# Patient Record
Sex: Male | Born: 1950 | Race: White | Marital: Married | Smoking: Former smoker
Health system: Southern US, Community
[De-identification: ages and names within clinical notes are randomized; demographics above are authoritative.]

## PROBLEM LIST (undated history)

## (undated) DIAGNOSIS — Z87442 Personal history of urinary calculi: Secondary | ICD-10-CM

## (undated) DIAGNOSIS — M1990 Unspecified osteoarthritis, unspecified site: Secondary | ICD-10-CM

## (undated) DIAGNOSIS — I10 Essential (primary) hypertension: Secondary | ICD-10-CM

## (undated) HISTORY — PX: CYSTO: SHX6284

## (undated) HISTORY — PX: BACK SURGERY: SHX140

---

## 2013-07-17 ENCOUNTER — Other Ambulatory Visit: Payer: Self-pay | Admitting: Neurosurgery

## 2013-07-17 DIAGNOSIS — M5126 Other intervertebral disc displacement, lumbar region: Secondary | ICD-10-CM

## 2013-07-18 ENCOUNTER — Ambulatory Visit
Admission: RE | Admit: 2013-07-18 | Discharge: 2013-07-18 | Disposition: A | Discharge status: Home / Self Care | Payer: BC Managed Care – PPO | Source: Ambulatory Visit | Attending: Neurosurgery | Admitting: Neurosurgery

## 2013-07-18 ENCOUNTER — Other Ambulatory Visit: Payer: Self-pay | Admitting: Neurosurgery

## 2013-07-18 VITALS — BP 145/92 | HR 88

## 2013-07-18 DIAGNOSIS — M5126 Other intervertebral disc displacement, lumbar region: Secondary | ICD-10-CM

## 2013-07-18 MED ORDER — METHYLPREDNISOLONE ACETATE 40 MG/ML INJ SUSP (RADIOLOG
120.0000 mg | Freq: Once | INTRAMUSCULAR | Status: AC
  Administered 2013-07-18: 120 mg via EPIDURAL

## 2013-07-18 MED ORDER — IOHEXOL 180 MG/ML  SOLN
1.0000 mL | Freq: Once | INTRAMUSCULAR | Status: AC | PRN
Start: 2013-07-18 — End: 2013-07-18
  Administered 2013-07-18: 1 mL via EPIDURAL

## 2013-07-18 NOTE — Discharge Instructions (Signed)

## 2013-07-31 ENCOUNTER — Other Ambulatory Visit: Payer: Self-pay | Admitting: Neurosurgery

## 2013-07-31 DIAGNOSIS — M5126 Other intervertebral disc displacement, lumbar region: Secondary | ICD-10-CM

## 2013-08-01 ENCOUNTER — Ambulatory Visit
Admission: RE | Admit: 2013-08-01 | Discharge: 2013-08-01 | Disposition: A | Discharge status: Home / Self Care | Payer: BC Managed Care – PPO | Source: Ambulatory Visit | Attending: Neurosurgery | Admitting: Neurosurgery

## 2013-08-01 DIAGNOSIS — M5126 Other intervertebral disc displacement, lumbar region: Secondary | ICD-10-CM

## 2013-08-01 MED ORDER — IOHEXOL 180 MG/ML  SOLN
1.0000 mL | Freq: Once | INTRAMUSCULAR | Status: AC | PRN
  Administered 2013-08-01: 1 mL via EPIDURAL

## 2013-08-01 MED ORDER — METHYLPREDNISOLONE ACETATE 40 MG/ML INJ SUSP (RADIOLOG
120.0000 mg | Freq: Once | INTRAMUSCULAR | Status: AC
  Administered 2013-08-01: 120 mg via EPIDURAL

## 2013-08-16 ENCOUNTER — Other Ambulatory Visit: Payer: Self-pay | Admitting: Neurosurgery

## 2013-08-17 ENCOUNTER — Encounter (HOSPITAL_COMMUNITY): Payer: Self-pay

## 2013-08-17 ENCOUNTER — Encounter (HOSPITAL_COMMUNITY)
Admission: RE | Admit: 2013-08-17 | Discharge: 2013-08-17 | Disposition: A | Discharge status: Home / Self Care | Payer: BC Managed Care – PPO | Source: Ambulatory Visit | Attending: Anesthesiology | Admitting: Anesthesiology

## 2013-08-17 ENCOUNTER — Encounter (HOSPITAL_COMMUNITY)
Admission: RE | Admit: 2013-08-17 | Discharge: 2013-08-17 | Disposition: A | Discharge status: Home / Self Care | Payer: BC Managed Care – PPO | Source: Ambulatory Visit | Attending: Neurosurgery | Admitting: Neurosurgery

## 2013-08-17 DIAGNOSIS — Z01818 Encounter for other preprocedural examination: Secondary | ICD-10-CM | POA: Insufficient documentation

## 2013-08-17 DIAGNOSIS — Z01810 Encounter for preprocedural cardiovascular examination: Secondary | ICD-10-CM | POA: Insufficient documentation

## 2013-08-17 DIAGNOSIS — Z01812 Encounter for preprocedural laboratory examination: Secondary | ICD-10-CM | POA: Insufficient documentation

## 2013-08-17 HISTORY — DX: Unspecified osteoarthritis, unspecified site: M19.90

## 2013-08-17 HISTORY — DX: Personal history of urinary calculi: Z87.442

## 2013-08-17 HISTORY — DX: Essential (primary) hypertension: I10

## 2013-08-17 LAB — CBC
HCT: 49.1 % (ref 39.0–52.0)
Hemoglobin: 17.7 g/dL — ABNORMAL HIGH (ref 13.0–17.0)
MCH: 33.5 pg (ref 26.0–34.0)
MCHC: 36 g/dL (ref 30.0–36.0)
MCV: 93 fL (ref 78.0–100.0)
PLATELETS: 222 10*3/uL (ref 150–400)
RBC: 5.28 MIL/uL (ref 4.22–5.81)
RDW: 13.5 % (ref 11.5–15.5)
WBC: 9.4 10*3/uL (ref 4.0–10.5)

## 2013-08-17 LAB — BASIC METABOLIC PANEL
BUN: 16 mg/dL (ref 6–23)
CALCIUM: 9.6 mg/dL (ref 8.4–10.5)
CO2: 25 meq/L (ref 19–32)
Chloride: 96 mEq/L (ref 96–112)
Creatinine, Ser: 0.81 mg/dL (ref 0.50–1.35)
GFR calc non Af Amer: >90 mL/min (ref >90)
Glucose, Bld: 106 mg/dL — ABNORMAL HIGH (ref 70–99)
Potassium: 3.7 mEq/L (ref 3.7–5.3)
Sodium: 137 mEq/L (ref 137–147)

## 2013-08-17 LAB — SURGICAL PCR SCREEN
MRSA, PCR: NEGATIVE
STAPHYLOCOCCUS AUREUS: NEGATIVE

## 2013-08-17 NOTE — Progress Notes (Signed)
08/17/13 1506  OBSTRUCTIVE SLEEP APNEA  Have you ever been diagnosed with sleep apnea through a sleep study? No  Do you snore loudly (loud enough to be heard through closed doors)?  0  Do you often feel tired, fatigued, or sleepy during the daytime? 0  Has anyone observed you stop breathing during your sleep? 1  Do you have, or are you being treated for high blood pressure? 1  BMI more than 35 kg/m2? 1  Age over 34102 years old? 1  Neck circumference greater than 40 cm/18 inches? 1  Gender: 1  Obstructive Sleep Apnea Score 6  Score 4 or greater  Results sent to PCP

## 2013-08-17 NOTE — Pre-Procedure Instructions (Addendum)
Jacob Sexton  08/17/2013   Your procedure is scheduled on:  08/21/13  Report to [facility] short stay admitting at 1000 AM.  Call this number if you have problems the morning of surgery: [phone]   Remember:   Do not eat food or drink liquids after midnight.   Take these medicines the morning of surgery with A SIP OF WATER: none             STOP all herbel meds, nsaids (aleve,naproxen,advil,ibuprofen) including vitamins, aspirin now   Do not wear jewelry, make-up or nail polish.  Do not wear lotions, powders, or perfumes. You may wear deodorant.  Do not shave 48 hours prior to surgery. Men may shave face and neck.  Do not bring valuables to the hospital.  [facility] is not responsible                  for any belongings or valuables.               Contacts, dentures or bridgework may not be worn into surgery.  Leave suitcase in the car. After surgery it may be brought to your room.  For patients admitted to the hospital, discharge time is determined by your                treatment team.               Patients discharged the day of surgery will not be allowed to drive  home.  Name and phone number of your driver:   Special Instructions:  Special Instructions: [place] - Preparing for Surgery  Before surgery, you can play an important role.  Because skin is not sterile, your skin needs to be as free of germs as possible.  You can reduce the number of germs on you skin by washing with CHG (chlorahexidine gluconate) soap before surgery.  CHG is an antiseptic cleaner which kills germs and bonds with the skin to continue killing germs even after washing.  Please DO NOT use if you have an allergy to CHG or antibacterial soaps.  If your skin becomes reddened/irritated stop using the CHG and inform your nurse when you arrive at Short Stay.  Do not shave (including legs and underarms) for at least 48 hours prior to the first CHG shower.  You may shave your face.  Please follow these  instructions carefully:   1.  Shower with CHG Soap the night before surgery and the morning of Surgery.  2.  If you choose to wash your hair, wash your hair first as usual with your normal shampoo.  3.  After you shampoo, rinse your hair and body thoroughly to remove the Shampoo.  4.  Use CHG as you would any other liquid soap.  You can apply chg directly  to the skin and wash gently with scrungie or a clean washcloth.  5.  Apply the CHG Soap to your body ONLY FROM THE NECK DOWN.  Do not use on open wounds or open sores.  Avoid contact with your eyes ears, mouth and genitals (private parts).  Wash genitals (private parts)       with your normal soap.  6.  Wash thoroughly, paying special attention to the area where your surgery will be performed.  7.  Thoroughly rinse your body with warm water from the neck down.  8.  DO NOT shower/wash with your normal soap after using and rinsing off the CHG Soap.  9.  Pat yourself dry with a clean towel.            10.  Wear clean pajamas.            11.  Place clean sheets on your bed the night of your first shower and do not sleep with pets.  Day of Surgery  Do not apply any lotions/deodorants the morning of surgery.  Please wear clean clothes to the hospital/surgery center.   Please read over the following fact sheets that you were given: Pain Booklet, Coughing and Deep Breathing and Surgical Site Infection Prevention mrsa screen

## 2013-08-17 NOTE — Progress Notes (Addendum)
req'd notes ,ekg, tests from dr Molinda Bailiffernest smith [facility]  [phone]  req'd op note, discharge note, anesthesia record, e[facility] [place] Chart left for allison zelenak pa to review

## 2013-08-17 NOTE — Progress Notes (Signed)
08/17/13 1445  OBSTRUCTIVE SLEEP APNEA  Have you ever been diagnosed with sleep apnea through a sleep study? No  Do you snore loudly (loud enough to be heard through closed doors)?  0  Do you often feel tired, fatigued, or sleepy during the daytime? 0  Has anyone observed you stop breathing during your sleep? 1  Do you have, or are you being treated for high blood pressure? 1  BMI more than 35 kg/m2? 1  Age over 61 years old? 1  Neck circumference greater than 40 cm/18 inches? 1 (19)  Gender: 1  Obstructive Sleep Apnea Score 6  Score 4 or greater  Results sent to PCP

## 2013-08-20 MED ORDER — CEFAZOLIN SODIUM-DEXTROSE 2-3 GM-% IV SOLR
2.0000 g | INTRAVENOUS | Status: AC
  Administered 2013-08-21: 2 g via INTRAVENOUS
  Filled 2013-08-20: qty 50

## 2013-08-20 MED ORDER — DEXAMETHASONE SODIUM PHOSPHATE 10 MG/ML IJ SOLN
10.0000 mg | INTRAMUSCULAR | Status: AC
  Administered 2013-08-21: 10 mg via INTRAVENOUS
  Filled 2013-08-20: qty 1

## 2013-08-20 NOTE — Progress Notes (Signed)
Anesthesia Chart Review:  Patient is a 63 year old male scheduled for right L5-S1 redo microdiskectomy on 08/21/13 by Dr. Gerlene FeeKritzer.  History includes former smoker, HTN, nephrolithiasis, arthritis, right L5-S1 laminotomy for excision of herniated disk on 05/25/13. BMi 38.7.  PCP is Molinda BailiffErnest Smith, PA-C with [facility] at [place].  EKG on 08/17/13 showed SR with first degree AVB, LAD, inferior infarct (age undetermined), anterior infarct (age undetermined).  Poor r wave progression in precordial leads in new since 01/29/13.   CXR on 08/17/13 showed minimal left basilar atelectasis noted.  Preoperative labs noted.  He tolerated surgery in 05/2013. No CV symptoms documented at his PAT visit on Friday.  If no acute changes then I anticipate that he can proceed as planned.  Anesthesiologist Dr. Michelle Piperssey agrees with this plan.  Velna Ochsllison Keylie Beavers, PA-C [facility]/Anesthesiology Phone [phone] 08/20/2013 11:22 AM

## 2013-08-21 ENCOUNTER — Encounter (HOSPITAL_COMMUNITY)
Admission: RE | Disposition: A | Discharge status: Home / Self Care | Payer: Self-pay | Source: Ambulatory Visit | Attending: Neurosurgery

## 2013-08-21 ENCOUNTER — Ambulatory Visit (HOSPITAL_COMMUNITY): Payer: BC Managed Care – PPO | Admitting: Anesthesiology

## 2013-08-21 ENCOUNTER — Encounter (HOSPITAL_COMMUNITY): Payer: BC Managed Care – PPO | Admitting: Vascular Surgery

## 2013-08-21 ENCOUNTER — Encounter (HOSPITAL_COMMUNITY): Payer: Self-pay | Admitting: Anesthesiology

## 2013-08-21 ENCOUNTER — Ambulatory Visit (HOSPITAL_COMMUNITY)
Admission: RE | Admit: 2013-08-21 | Discharge: 2013-08-22 | Disposition: A | Discharge status: Home / Self Care | Payer: BC Managed Care – PPO | Source: Ambulatory Visit | Attending: Neurosurgery | Admitting: Neurosurgery

## 2013-08-21 DIAGNOSIS — Z87891 Personal history of nicotine dependence: Secondary | ICD-10-CM | POA: Insufficient documentation

## 2013-08-21 DIAGNOSIS — M5126 Other intervertebral disc displacement, lumbar region: Secondary | ICD-10-CM | POA: Diagnosis present

## 2013-08-21 DIAGNOSIS — I10 Essential (primary) hypertension: Secondary | ICD-10-CM | POA: Insufficient documentation

## 2013-08-21 HISTORY — PX: LUMBAR LAMINECTOMY/DECOMPRESSION MICRODISCECTOMY: SHX5026

## 2013-08-21 SURGERY — LUMBAR LAMINECTOMY/DECOMPRESSION MICRODISCECTOMY 1 LEVEL
Anesthesia: General | Site: Back | Laterality: Right

## 2013-08-21 MED ORDER — PHENOL 1.4 % MT LIQD: 1.0000 | OROMUCOSAL | Status: DC | PRN

## 2013-08-21 MED ORDER — PANTOPRAZOLE SODIUM 40 MG IV SOLR
40.0000 mg | Freq: Every day | INTRAVENOUS | Status: DC
Start: 2013-08-21 — End: 2013-08-22
  Administered 2013-08-21: 40 mg via INTRAVENOUS
  Filled 2013-08-21 (×2): qty 40

## 2013-08-21 MED ORDER — ROCURONIUM BROMIDE 50 MG/5ML IV SOLN
INTRAVENOUS | Status: AC
  Filled 2013-08-21: qty 1

## 2013-08-21 MED ORDER — LISINOPRIL 20 MG PO TABS
20.0000 mg | ORAL_TABLET | Freq: Every day | ORAL | Status: DC
  Administered 2013-08-21: 20 mg via ORAL
  Filled 2013-08-21 (×2): qty 1

## 2013-08-21 MED ORDER — HYDROCODONE-ACETAMINOPHEN 5-325 MG PO TABS
1.0000 | ORAL_TABLET | ORAL | Status: DC | PRN
  Administered 2013-08-21 – 2013-08-22 (×3): 1 via ORAL
  Filled 2013-08-21 (×3): qty 1

## 2013-08-21 MED ORDER — KCL IN DEXTROSE-NACL 20-5-0.45 MEQ/L-%-% IV SOLN
80.0000 mL/h | INTRAVENOUS | Status: DC
  Filled 2013-08-21 (×3): qty 1000

## 2013-08-21 MED ORDER — SODIUM CHLORIDE 0.9 % IJ SOLN
3.0000 mL | INTRAMUSCULAR | Status: DC | PRN
  Administered 2013-08-21 – 2013-08-22 (×2): 3 mL via INTRAVENOUS

## 2013-08-21 MED ORDER — 0.9 % SODIUM CHLORIDE (POUR BTL) OPTIME
TOPICAL | Status: DC | PRN
  Administered 2013-08-21: 1000 mL

## 2013-08-21 MED ORDER — LISINOPRIL-HYDROCHLOROTHIAZIDE 20-12.5 MG PO TABS: 1.0000 | ORAL_TABLET | Freq: Every day | ORAL | Status: DC

## 2013-08-21 MED ORDER — FENTANYL CITRATE 0.05 MG/ML IJ SOLN
INTRAMUSCULAR | Status: AC
  Filled 2013-08-21: qty 2

## 2013-08-21 MED ORDER — FENTANYL CITRATE 0.05 MG/ML IJ SOLN
25.0000 ug | INTRAMUSCULAR | Status: DC | PRN
  Administered 2013-08-21 (×3): 50 ug via INTRAVENOUS

## 2013-08-21 MED ORDER — ONDANSETRON HCL 4 MG/2ML IJ SOLN: 4.0000 mg | INTRAMUSCULAR | Status: DC | PRN

## 2013-08-21 MED ORDER — SODIUM CHLORIDE 0.9 % IJ SOLN
3.0000 mL | Freq: Two times a day (BID) | INTRAMUSCULAR | Status: DC
  Administered 2013-08-21: 3 mL via INTRAVENOUS

## 2013-08-21 MED ORDER — THROMBIN 5000 UNITS EX SOLR
CUTANEOUS | Status: DC | PRN
  Administered 2013-08-21 (×2): 5000 [IU] via TOPICAL

## 2013-08-21 MED ORDER — PROPOFOL 10 MG/ML IV BOLUS
INTRAVENOUS | Status: AC
  Filled 2013-08-21: qty 20

## 2013-08-21 MED ORDER — MIDAZOLAM HCL 2 MG/2ML IJ SOLN
INTRAMUSCULAR | Status: AC
  Filled 2013-08-21: qty 2

## 2013-08-21 MED ORDER — SODIUM CHLORIDE 0.9 % IR SOLN
Status: DC | PRN
  Administered 2013-08-21: 12:00:00

## 2013-08-21 MED ORDER — ARTIFICIAL TEARS OP OINT
TOPICAL_OINTMENT | OPHTHALMIC | Status: DC | PRN
  Administered 2013-08-21: 1 via OPHTHALMIC

## 2013-08-21 MED ORDER — DEXAMETHASONE 4 MG PO TABS
4.0000 mg | ORAL_TABLET | Freq: Four times a day (QID) | ORAL | Status: AC
  Administered 2013-08-21 (×2): 4 mg via ORAL
  Filled 2013-08-21 (×2): qty 1

## 2013-08-21 MED ORDER — HEMOSTATIC AGENTS (NO CHARGE) OPTIME
TOPICAL | Status: DC | PRN
  Administered 2013-08-21: 1 via TOPICAL

## 2013-08-21 MED ORDER — ROCURONIUM BROMIDE 100 MG/10ML IV SOLN
INTRAVENOUS | Status: DC | PRN
  Administered 2013-08-21: 10 mg via INTRAVENOUS
  Administered 2013-08-21: 50 mg via INTRAVENOUS

## 2013-08-21 MED ORDER — CYCLOBENZAPRINE HCL 10 MG PO TABS
10.0000 mg | ORAL_TABLET | Freq: Three times a day (TID) | ORAL | Status: DC | PRN
Start: 2013-08-21 — End: 2013-08-22
  Administered 2013-08-21 – 2013-08-22 (×2): 10 mg via ORAL
  Filled 2013-08-21 (×2): qty 1

## 2013-08-21 MED ORDER — LACTATED RINGERS IV SOLN
INTRAVENOUS | Status: DC | PRN
  Administered 2013-08-21 (×2): via INTRAVENOUS

## 2013-08-21 MED ORDER — ACETAMINOPHEN 325 MG PO TABS: 650.0000 mg | ORAL_TABLET | ORAL | Status: DC | PRN

## 2013-08-21 MED ORDER — MIDAZOLAM HCL 5 MG/5ML IJ SOLN
INTRAMUSCULAR | Status: DC | PRN
  Administered 2013-08-21: 2 mg via INTRAVENOUS

## 2013-08-21 MED ORDER — FENTANYL CITRATE 0.05 MG/ML IJ SOLN
INTRAMUSCULAR | Status: AC
  Filled 2013-08-21: qty 5

## 2013-08-21 MED ORDER — ZOLPIDEM TARTRATE 5 MG PO TABS: 5.0000 mg | ORAL_TABLET | Freq: Every evening | ORAL | Status: DC | PRN

## 2013-08-21 MED ORDER — KETOROLAC TROMETHAMINE 30 MG/ML IJ SOLN
INTRAMUSCULAR | Status: DC | PRN
  Administered 2013-08-21: 30 mg via INTRAVENOUS

## 2013-08-21 MED ORDER — TAMSULOSIN HCL 0.4 MG PO CAPS
0.4000 mg | ORAL_CAPSULE | Freq: Every day | ORAL | Status: DC
  Filled 2013-08-21: qty 1

## 2013-08-21 MED ORDER — LIDOCAINE HCL (CARDIAC) 20 MG/ML IV SOLN
INTRAVENOUS | Status: DC | PRN
  Administered 2013-08-21: 60 mg via INTRAVENOUS

## 2013-08-21 MED ORDER — FENTANYL CITRATE 0.05 MG/ML IJ SOLN
INTRAMUSCULAR | Status: DC | PRN
  Administered 2013-08-21: 150 ug via INTRAVENOUS
  Administered 2013-08-21 (×2): 50 ug via INTRAVENOUS

## 2013-08-21 MED ORDER — PHENYLEPHRINE HCL 10 MG/ML IJ SOLN
INTRAMUSCULAR | Status: DC | PRN
  Administered 2013-08-21 (×3): 80 ug via INTRAVENOUS

## 2013-08-21 MED ORDER — LACTATED RINGERS IV SOLN
INTRAVENOUS | Status: DC
  Administered 2013-08-21: 10:00:00 via INTRAVENOUS

## 2013-08-21 MED ORDER — NEOSTIGMINE METHYLSULFATE 1 MG/ML IJ SOLN
INTRAMUSCULAR | Status: DC | PRN
  Administered 2013-08-21: 3 mg via INTRAVENOUS

## 2013-08-21 MED ORDER — GLYCOPYRROLATE 0.2 MG/ML IJ SOLN
INTRAMUSCULAR | Status: DC | PRN
  Administered 2013-08-21: 0.4 mg via INTRAVENOUS

## 2013-08-21 MED ORDER — MENTHOL 3 MG MT LOZG: 1.0000 | LOZENGE | OROMUCOSAL | Status: DC | PRN

## 2013-08-21 MED ORDER — PHENYLEPHRINE 40 MCG/ML (10ML) SYRINGE FOR IV PUSH (FOR BLOOD PRESSURE SUPPORT)
PREFILLED_SYRINGE | INTRAVENOUS | Status: AC
  Filled 2013-08-21: qty 10

## 2013-08-21 MED ORDER — KETOROLAC TROMETHAMINE 30 MG/ML IJ SOLN
30.0000 mg | Freq: Four times a day (QID) | INTRAMUSCULAR | Status: DC
  Administered 2013-08-21 – 2013-08-22 (×3): 30 mg via INTRAVENOUS
  Filled 2013-08-21 (×7): qty 1

## 2013-08-21 MED ORDER — HYDROMORPHONE HCL PF 1 MG/ML IJ SOLN: 1.0000 mg | INTRAMUSCULAR | Status: DC | PRN

## 2013-08-21 MED ORDER — SODIUM CHLORIDE 0.9 % IV SOLN: 250.0000 mL | INTRAVENOUS | Status: DC

## 2013-08-21 MED ORDER — DEXAMETHASONE SODIUM PHOSPHATE 4 MG/ML IJ SOLN: 4.0000 mg | Freq: Four times a day (QID) | INTRAMUSCULAR | Status: AC

## 2013-08-21 MED ORDER — BUPIVACAINE HCL (PF) 0.5 % IJ SOLN
INTRAMUSCULAR | Status: DC | PRN
  Administered 2013-08-21: 10 mL

## 2013-08-21 MED ORDER — PROPOFOL 10 MG/ML IV BOLUS
INTRAVENOUS | Status: DC | PRN
  Administered 2013-08-21: 200 mg via INTRAVENOUS

## 2013-08-21 MED ORDER — ACETAMINOPHEN 650 MG RE SUPP: 650.0000 mg | RECTAL | Status: DC | PRN

## 2013-08-21 MED ORDER — HYDROCHLOROTHIAZIDE 12.5 MG PO CAPS
12.5000 mg | ORAL_CAPSULE | Freq: Every day | ORAL | Status: DC
  Filled 2013-08-21: qty 1

## 2013-08-21 SURGICAL SUPPLY — 52 items
BAG DECANTER FOR FLEXI CONT (MISCELLANEOUS) ×3 IMPLANT
BENZOIN TINCTURE PRP APPL 2/3 (GAUZE/BANDAGES/DRESSINGS) ×3 IMPLANT
BLADE SURG ROTATE 9660 (MISCELLANEOUS) ×3 IMPLANT
BRUSH SCRUB EZ PLAIN DRY (MISCELLANEOUS) ×3 IMPLANT
BUR CUTTER 7.0 ROUND (BURR) ×3 IMPLANT
BUR MATCHSTICK NEURO 3.0 LAGG (BURR) ×3 IMPLANT
CANISTER SUCT 3000ML (MISCELLANEOUS) ×3 IMPLANT
CLOSURE WOUND 1/2 X4 (GAUZE/BANDAGES/DRESSINGS) ×1
CONT SPEC 4OZ CLIKSEAL STRL BL (MISCELLANEOUS) ×3 IMPLANT
DERMABOND ADHESIVE PROPEN (GAUZE/BANDAGES/DRESSINGS) ×2
DERMABOND ADVANCED (GAUZE/BANDAGES/DRESSINGS) ×2
DERMABOND ADVANCED .7 DNX12 (GAUZE/BANDAGES/DRESSINGS) ×1 IMPLANT
DERMABOND ADVANCED .7 DNX6 (GAUZE/BANDAGES/DRESSINGS) ×1 IMPLANT
DRAPE LAPAROTOMY 100X72X124 (DRAPES) ×3 IMPLANT
DRAPE MICROSCOPE ZEISS OPMI (DRAPES) ×3 IMPLANT
DRAPE SURG 17X23 STRL (DRAPES) ×6 IMPLANT
DRESSING TELFA 8X3 (GAUZE/BANDAGES/DRESSINGS) ×3 IMPLANT
DURAPREP 26ML APPLICATOR (WOUND CARE) ×3 IMPLANT
ELECT REM PT RETURN 9FT ADLT (ELECTROSURGICAL) ×3
ELECTRODE REM PT RTRN 9FT ADLT (ELECTROSURGICAL) ×1 IMPLANT
GAUZE SPONGE 4X4 16PLY XRAY LF (GAUZE/BANDAGES/DRESSINGS) IMPLANT
GLOVE BIOGEL PI IND STRL 7.0 (GLOVE) ×1 IMPLANT
GLOVE BIOGEL PI INDICATOR 7.0 (GLOVE) ×2
GLOVE ECLIPSE 8.0 STRL XLNG CF (GLOVE) ×3 IMPLANT
GLOVE SURG SS PI 7.0 STRL IVOR (GLOVE) ×6 IMPLANT
GOWN BRE IMP SLV AUR LG STRL (GOWN DISPOSABLE) IMPLANT
GOWN BRE IMP SLV AUR XL STRL (GOWN DISPOSABLE) IMPLANT
GOWN STRL REIN 2XL LVL4 (GOWN DISPOSABLE) IMPLANT
GOWN STRL REUS W/ TWL LRG LVL3 (GOWN DISPOSABLE) ×2 IMPLANT
GOWN STRL REUS W/ TWL XL LVL3 (GOWN DISPOSABLE) ×1 IMPLANT
GOWN STRL REUS W/TWL LRG LVL3 (GOWN DISPOSABLE) ×4
GOWN STRL REUS W/TWL XL LVL3 (GOWN DISPOSABLE) ×2
KIT BASIN OR (CUSTOM PROCEDURE TRAY) ×3 IMPLANT
KIT ROOM TURNOVER OR (KITS) ×3 IMPLANT
NEEDLE HYPO 22GX1.5 SAFETY (NEEDLE) ×3 IMPLANT
NEEDLE SPNL 22GX3.5 QUINCKE BK (NEEDLE) ×6 IMPLANT
NS IRRIG 1000ML POUR BTL (IV SOLUTION) ×3 IMPLANT
PACK LAMINECTOMY NEURO (CUSTOM PROCEDURE TRAY) ×3 IMPLANT
PAD ARMBOARD 7.5X6 YLW CONV (MISCELLANEOUS) ×9 IMPLANT
PATTIES SURGICAL .75X.75 (GAUZE/BANDAGES/DRESSINGS) ×3 IMPLANT
RUBBERBAND STERILE (MISCELLANEOUS) ×6 IMPLANT
SPONGE GAUZE 4X4 12PLY (GAUZE/BANDAGES/DRESSINGS) ×3 IMPLANT
SPONGE SURGIFOAM ABS GEL SZ50 (HEMOSTASIS) ×3 IMPLANT
STRIP CLOSURE SKIN 1/2X4 (GAUZE/BANDAGES/DRESSINGS) ×2 IMPLANT
SUT VIC AB 2-0 OS6 18 (SUTURE) ×9 IMPLANT
SUT VIC AB 3-0 CP2 18 (SUTURE) ×3 IMPLANT
SYR 20ML ECCENTRIC (SYRINGE) ×3 IMPLANT
SYR BULB IRRIGATION 50ML (SYRINGE) ×3 IMPLANT
TAPE STRIPS DRAPE STRL (GAUZE/BANDAGES/DRESSINGS) ×3 IMPLANT
TOWEL OR 17X24 6PK STRL BLUE (TOWEL DISPOSABLE) ×3 IMPLANT
TOWEL OR 17X26 10 PK STRL BLUE (TOWEL DISPOSABLE) ×3 IMPLANT
WATER STERILE IRR 1000ML POUR (IV SOLUTION) ×3 IMPLANT

## 2013-08-21 NOTE — Anesthesia Preprocedure Evaluation (Addendum)
Anesthesia Evaluation   Patient awake    Reviewed: Allergy & Precautions, H&P , NPO status   History of Anesthesia Complications Negative for: history of anesthetic complications  Airway Mallampati: III TM Distance: >3 FB Neck ROM: Full    Dental  (+) Dental Advisory Given   Pulmonary former smoker,          Cardiovascular hypertension, Pt. on medications     Neuro/Psych negative neurological ROS  negative psych ROS   GI/Hepatic negative GI ROS, Neg liver ROS,   Endo/Other  negative endocrine ROS  Renal/GU negative Renal ROS     Musculoskeletal   Abdominal   Peds  Hematology negative hematology ROS (+)   Anesthesia Other Findings   Reproductive/Obstetrics                         Anesthesia Physical Anesthesia Plan  ASA: III  Anesthesia Plan: General   Post-op Pain Management:    Induction: Intravenous  Airway Management Planned: Oral ETT  Additional Equipment:   Intra-op Plan:   Post-operative Plan: Extubation in OR  Informed Consent: I have reviewed the patients History and Physical, chart, labs and discussed the procedure including the risks, benefits and alternatives for the proposed anesthesia with the patient or authorized representative who has indicated his/her understanding and acceptance.   Dental advisory given  Plan Discussed with: CRNA, Anesthesiologist and Surgeon  Anesthesia Plan Comments:         Anesthesia Quick Evaluation

## 2013-08-21 NOTE — Preoperative (Signed)
Beta Blockers   Reason not to administer Beta Blockers:Not Applicable 

## 2013-08-21 NOTE — H&P (Signed)
  Jacob Sexton is an 63 y.o. male.   Chief Complaint: Right leg pain HPI: The patient is a [age] year old gentleman who had a lumbar microdiscectomy approximately 5 months ago. He initially did extremely well but then developed some recurrent leg pain. He was tried on conservative therapy which gave him no relief. He underwent imaging studies included MRI scan with gadolinium which showed a massive tissue causing marked compression of the nerve root on the right side. This did enhance with gadolinium and it was questionable whether this was a recurrent disc herniation or an exuberant scar reaction. He failed additional conservative therapy and we discussed exploring the area to remove this tissue and decompressed the nerve once again. He wants to go in that direction and now comes for the procedure this time. I've had a long discussion with him regarding the risks and benefits of surgical intervention. The risks discussed include but are not limited to bleeding infection weakness numbness paralysis spinal fluid leak coma and death. We have discussed alternative methods of therapy although risks and benefits of nonintervention. He's had the opportunity to ask numerous questions and appears to understand. With this information in hand he has requested we proceed with surgery.  Past Medical History  Diagnosis Date  . Hypertension   . History of kidney stones   . Arthritis     Past Surgical History  Procedure Laterality Date  . Back surgery    . Cysto      No family history on file. Social History:  reports that he quit smoking about 30 years ago. His smoking use included Cigarettes. He has a 15 pack-year smoking history. He does not have any smokeless tobacco history on file. He reports that he does not drink alcohol or use illicit drugs.  Allergies: No Known Allergies  Medications Prior to Admission  Medication Sig Dispense Refill  . aspirin EC 81 MG tablet Take 81 mg by mouth every evening.       . chlorzoxazone (PARAFON) 500 MG tablet Take 500 mg by mouth 4 (four) times daily as needed for muscle spasms.      Marland Kitchen. lisinopril-hydrochlorothiazide (PRINZIDE,ZESTORETIC) 20-12.5 MG per tablet Take 1 tablet by mouth daily.      . niacin-simvastatin (SIMCOR) 1000-20 MG 24 hr tablet Take 1 tablet by mouth at bedtime.      . Omega-3 Fatty Acids (FISH OIL) 1000 MG CAPS Take 2 capsules by mouth 2 (two) times daily.      . tamsulosin (FLOMAX) 0.4 MG CAPS capsule Take 0.4 mg by mouth.        No results found for this or any previous visit (from the past 48 hour(s)). No results found.  A comprehensive review of systems was negative.  Blood pressure 163/96, pulse 77, temperature 97.1 F (36.2 C), temperature source Oral, resp. rate 18, SpO2 97.00%.  The patient is awake alert and oriented. His no facial asymmetry. His gait is slow mildly antalgic. He has decreased reflexes but his strength is intact. His sensation is decreased on the right foot. Assessment/Plan Impression is that of a recurrent disc herniation versus exuberant scar reaction L5-S1 on the right. The plan is for a right L5-S1 exploration with removal of the compressive tissue and decompression the S1 nerve root.  Reinaldo MeekerKRITZER,Verbon Giangregorio O, MD 08/21/2013, 11:33 AM

## 2013-08-21 NOTE — Op Note (Signed)
Preop diagnosis: Herniated disc L5-S1 right, recurrent Postop diagnosis: Same Procedure: Right L5-S1 redo microdiscectomy Surgeon: Rayshawn Maney Assistant: [place]  After being placed the prone position the patient's back was prepped and draped in the usual sterile fashion. Previous lumbar incision was opened and carried on the spinous processes. Subperiosteal dissection was then carried out on the right side of the spinous processes and to the residual lamina of L5 and S1. Self-retaining retractor was placed for exposure. Edges of the previous laminotomy were identified and the laminotomy slightly enlarged. This was scar tissue was dissected free were able to dissect down to the nerve root without much difficulty and seen going around the pedicle of S1. We then dissected superiorly to identify the L5-S1 disc. The microscope was draped brought in the field and used for the remainder of the [place]. Using microdissection technique were able to identify the disc space and incised a 15 blade. Using pituitary rongeurs and curettes we did thorough displaced cleanout once again. I released the nerve root there was a single fragment of disc material this was removed and a single pacing gave excellent decompression. We then able to mobilize the nerve root further and then completed our disc space cleanout. We inspected superior the disc space and also found another small fragment of disc which was removed. At this time we inspected all directions for any evidence of residual compression and none could be identified. Irrigation was carried out and any bleeding controlled with upper coagulation and Gelfoam. The wound was then closed in multiple layers of Vicryl on the muscle fascia subcutaneous and subcuticular tissues. Dermabond and Steri-Strips were placed on the skin. A sterile dressing was then applied and the patient was extubated and taken to recovery room in stable condition.

## 2013-08-21 NOTE — Transfer of Care (Signed)
Immediate Anesthesia Transfer of Care Note  Patient: Jacob Sexton  Procedure(s) Performed: Procedure(s) with comments: LUMBAR LAMINECTOMY/DECOMPRESSION MICRODISCECTOMY REDO, LUMBAR FIVE-SACRAL ONE (Right) - LUMBAR LAMINECTOMY/DECOMPRESSION MICRODISCECTOMY REDO, LUMBAR FIVE-SACRAL ONE  Patient Location: PACU  Anesthesia Type:General  Level of Consciousness: awake, alert  and oriented  Airway & Oxygen Therapy: Patient Spontanous Breathing and Patient connected to nasal cannula oxygen  Post-op Assessment: Report given to PACU RN and Post -op Vital signs reviewed and stable  Post vital signs: Reviewed and stable  Complications: No apparent anesthesia complications

## 2013-08-21 NOTE — Anesthesia Postprocedure Evaluation (Signed)
  Anesthesia Post-op Note  Patient: Jacob Sexton  Procedure(s) Performed: Procedure(s) with comments: LUMBAR LAMINECTOMY/DECOMPRESSION MICRODISCECTOMY REDO, LUMBAR FIVE-SACRAL ONE (Right) - LUMBAR LAMINECTOMY/DECOMPRESSION MICRODISCECTOMY REDO, LUMBAR FIVE-SACRAL ONE  Patient Location: PACU  Anesthesia Type:General  Level of Consciousness: awake  Airway and Oxygen Therapy: Patient Spontanous Breathing  Post-op Pain: mild  Post-op Assessment: Post-op Vital signs reviewed  Post-op Vital Signs: Reviewed  Complications: No apparent anesthesia complications

## 2013-08-22 MED ORDER — HYDROCODONE-ACETAMINOPHEN 5-325 MG PO TABS: 1.0000 | ORAL_TABLET | ORAL | Status: AC | PRN

## 2013-08-22 NOTE — Progress Notes (Signed)
Pt doing well. Pt was given D/C instructions with Rx's, verbal understanding was given. Pt D/C'd home via wheelchair @ 0945 per MD order. Rema FendtAshley Milinda Sweeney, RN

## 2013-08-22 NOTE — Discharge Summary (Signed)
  Physician Discharge Summary  Patient ID: Jacob Sexton MRN: [id number] DOB/AGE: Feb 09, 1951 63 y.[place].  Admit date: 08/21/2013 Discharge date: 08/22/2013  Admission Diagnoses:  Discharge Diagnoses:  Active Problems:   Lumbar herniated disc   Discharged Condition: good  Hospital Course: Surgery one day for recurrent HNP L5S1 right. Did well. No pain post op. Up ambuklating without difficulty. Wound clean and dry. Home POD 1, specific instructions given.  Consults: None  Significant Diagnostic Studies: none  Treatments: surgery: L5S1 discectomy  Discharge Exam: Blood pressure 125/76, pulse 77, temperature 98.5 F (36.9 C), temperature source Oral, resp. rate 16, SpO2 92.00%. clean and dry incision; no new neuro issues  Disposition: Final discharge disposition not confirmed     Medication List    ASK your doctor about these medications       aspirin EC 81 MG tablet  Take 81 mg by mouth every evening.     chlorzoxazone 500 MG tablet  Commonly known as:  PARAFON  Take 500 mg by mouth 4 (four) times daily as needed for muscle spasms.     Fish Oil 1000 MG Caps  Take 2 capsules by mouth 2 (two) times daily.     lisinopril-hydrochlorothiazide 20-12.5 MG per tablet  Commonly known as:  PRINZIDE,ZESTORETIC  Take 1 tablet by mouth daily.     niacin-simvastatin 1000-20 MG 24 hr tablet  Commonly known as:  SIMCOR  Take 1 tablet by mouth at bedtime.     tamsulosin 0.4 MG Caps capsule  Commonly known as:  FLOMAX  Take 0.4 mg by mouth.         At home rest most of the time. Get up 9 or 10 times each day and take a 15 or 20 minute walk. No riding in the car and to your first postoperative appointment. If you have neck surgery you may shower from the chest down starting on the third postoperative day. If you had back surgery he may start showering on the third postoperative day with saran wrap wrapped around your incisional area 3 times. After the shower remove the saran  wrap. Take pain medicine as needed and other medications as instructed. Call my office for an appointment.  SignedReinaldo Meeker: Jacob Heide O, MD 08/22/2013, 8:54 AM

## 2013-08-23 ENCOUNTER — Encounter (HOSPITAL_COMMUNITY): Payer: Self-pay | Admitting: Neurosurgery

## 2017-08-25 ENCOUNTER — Other Ambulatory Visit: Payer: Self-pay | Admitting: Neurological Surgery

## 2017-08-25 DIAGNOSIS — M4727 Other spondylosis with radiculopathy, lumbosacral region: Secondary | ICD-10-CM

## 2017-09-08 ENCOUNTER — Ambulatory Visit
Admission: RE | Admit: 2017-09-08 | Discharge: 2017-09-08 | Disposition: A | Discharge status: Home / Self Care | Payer: Medicare HMO | Source: Ambulatory Visit | Attending: Neurological Surgery | Admitting: Neurological Surgery

## 2017-09-08 DIAGNOSIS — M4727 Other spondylosis with radiculopathy, lumbosacral region: Secondary | ICD-10-CM

## 2017-09-08 MED ORDER — GADOBENATE DIMEGLUMINE 529 MG/ML IV SOLN
20.0000 mL | Freq: Once | INTRAVENOUS | Status: AC | PRN
  Administered 2017-09-08: 20 mL via INTRAVENOUS

## 2019-01-22 IMAGING — MR MR LUMBAR SPINE WO/W CM
4 of 7 series · 27 of 48 positions shown · IV contrast (multihance)
Comparison: CT scan 12/09/2016.  Prior MRI from 07/16/2013

CLINICAL DATA: Back pain radiating into the right hip and leg with
right leg weakness and numbness.

EXAM:
MRI LUMBAR SPINE WITHOUT AND WITH CONTRAST
TECHNIQUE: Multiplanar and multiecho pulse sequences of the lumbar spine were
obtained without and with intravenous contrast.
CONTRAST:  20mL MULTIHANCE GADOBENATE DIMEGLUMINE 529 MG/ML IV SOLN
Creatinine was obtained on site at [HOSPITAL] at [HOSPITAL].
Results: Creatinine 0.9 mg/dL.

[Series 3: T2 post-contrast · sagittal · 4.0mm · 0.55mm/px · 3 of 15 slices shown]
[im 1/15]
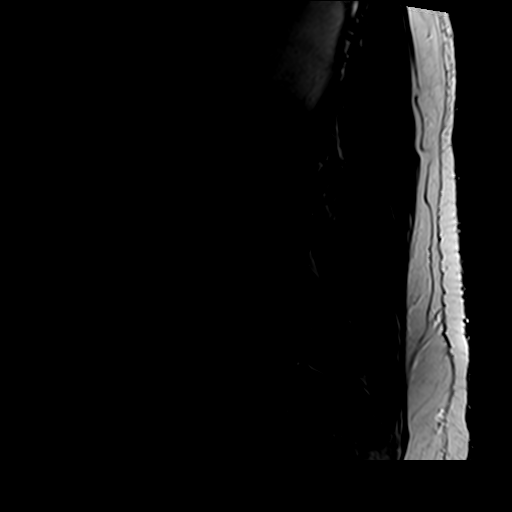
[im 8/15]
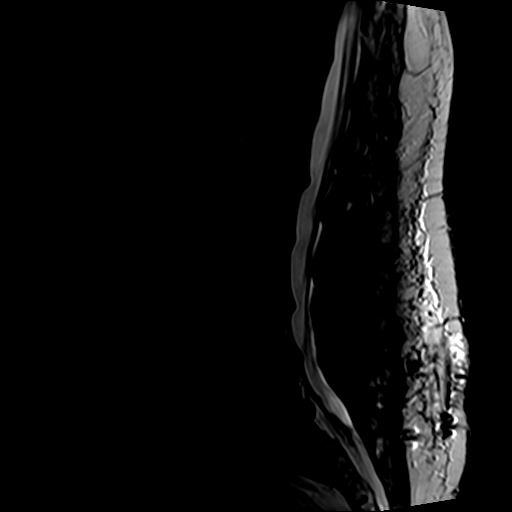
[im 15/15]
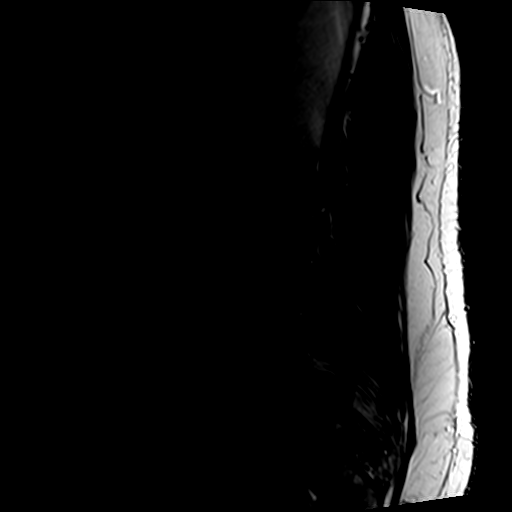

[Series 5: T1 · sagittal · 4.0mm · 0.55mm/px · 4 of 15 slices shown (1 of 2)]
[im 1/15]
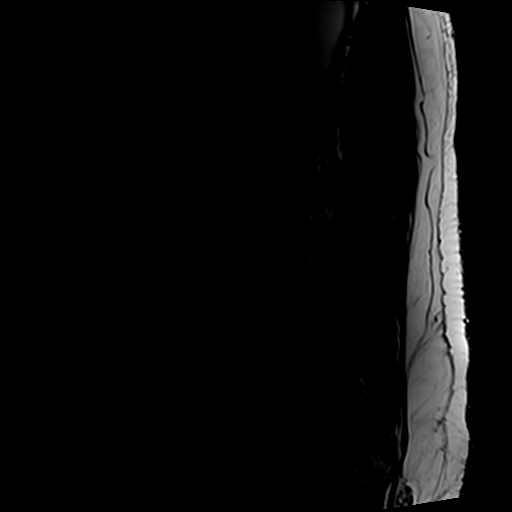
[im 5/15]
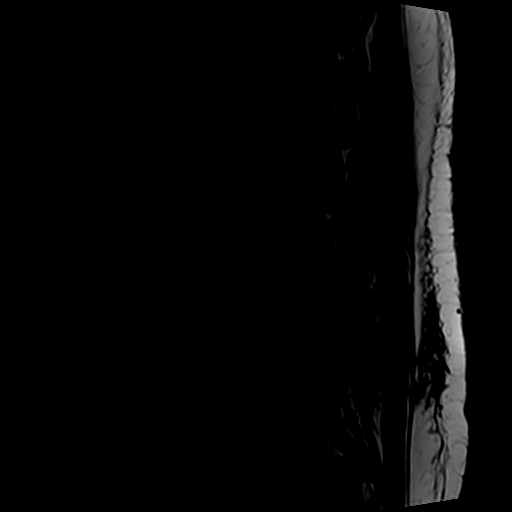
[im 10/15]
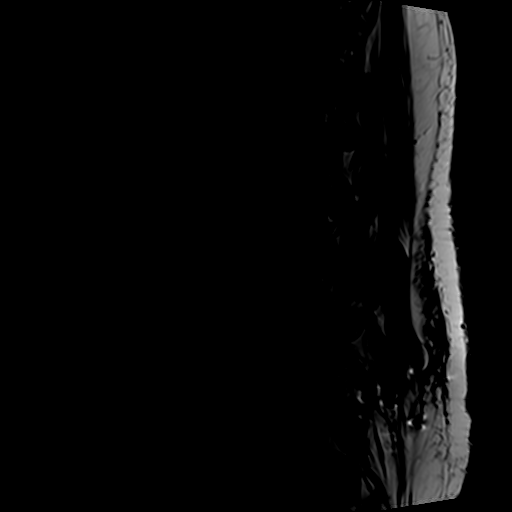
[im 15/15]
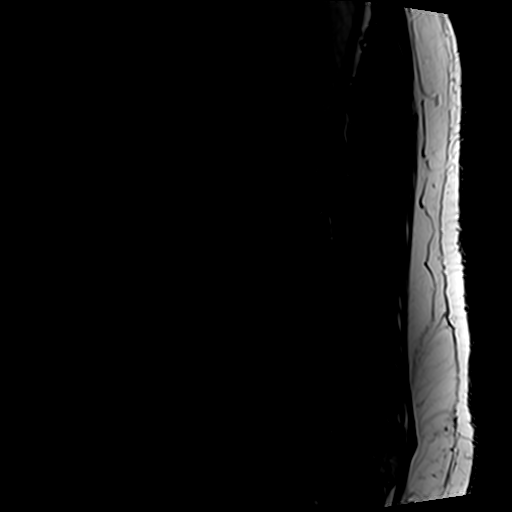

[Series 6: T1 · axial · 4.0mm · 0.35mm/px · z∈[-111,+86]mm · 9 of 39 slices shown (2 of 2)]
[im 1/39]
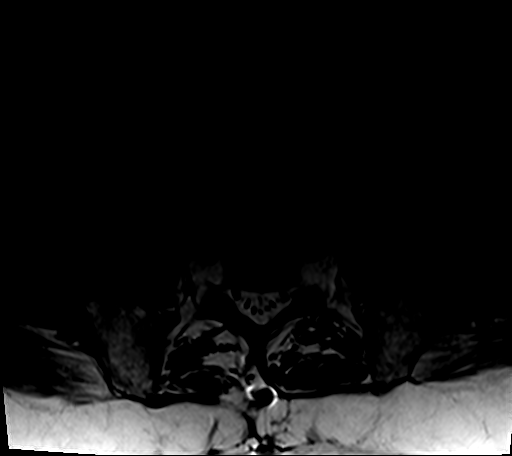
[im 4/39]
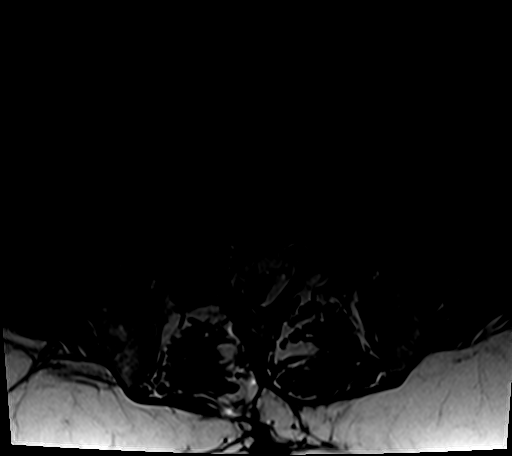
[im 8/39]
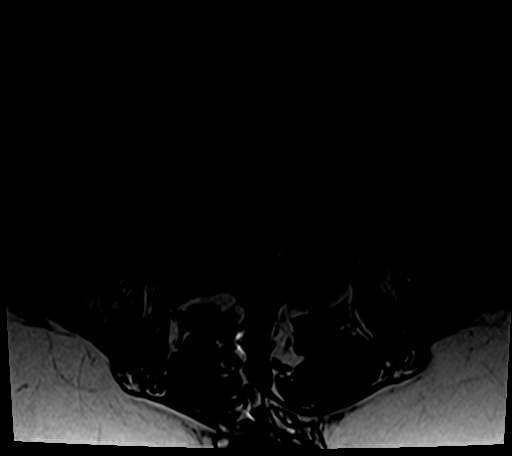
[im 12/39]
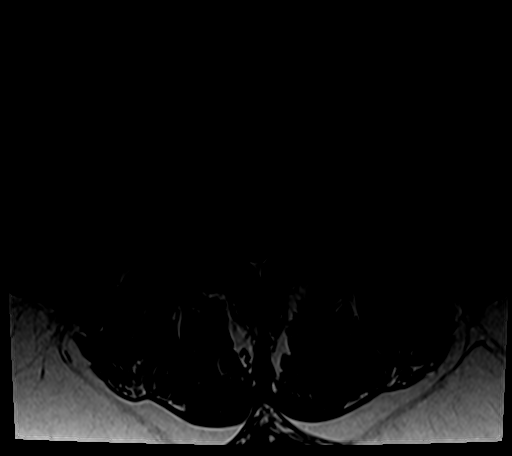
[im 16/39]
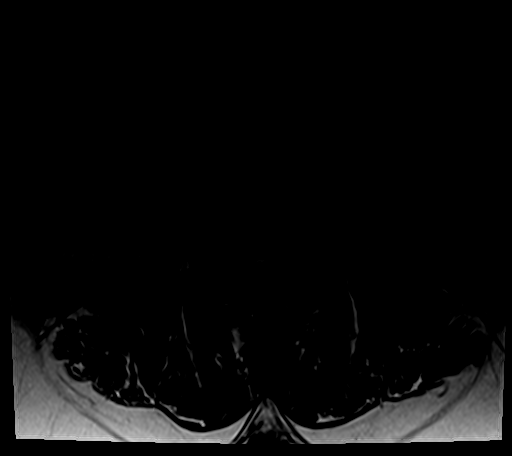
[im 20/39]
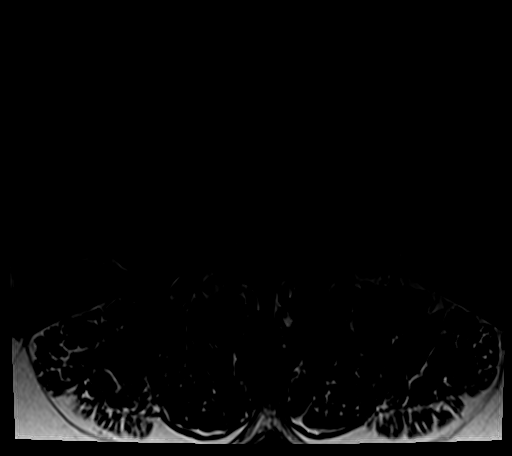
[im 23/39]
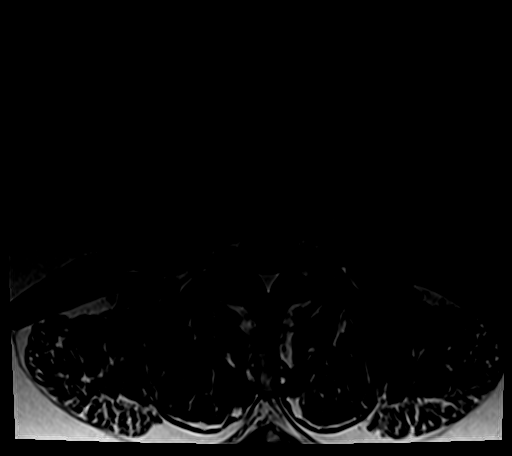
[im 27/39]
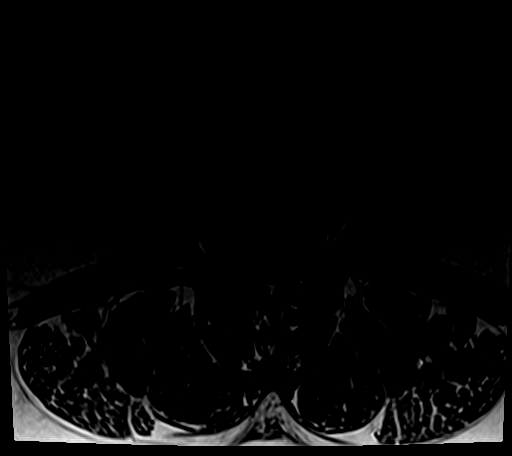
[im 35/39]
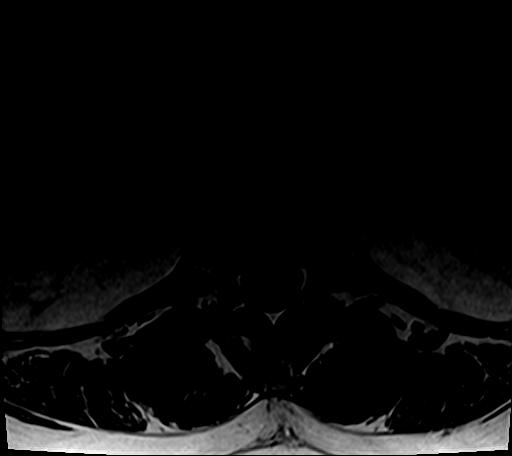

[Series 7: T2 · axial · 4.0mm · 0.70mm/px · z∈[-111,+106]mm · 11 of 39 slices shown]
[im 1/39]
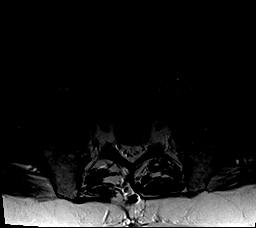
[im 4/39]
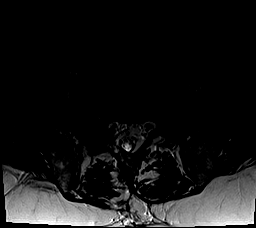
[im 8/39]
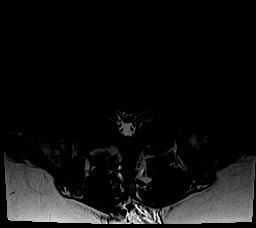
[im 12/39]
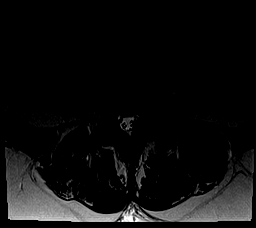
[im 16/39]
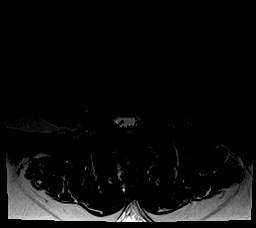
[im 20/39]
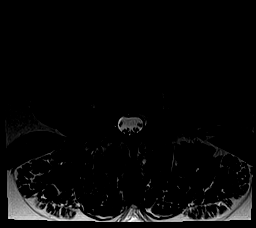
[im 23/39]
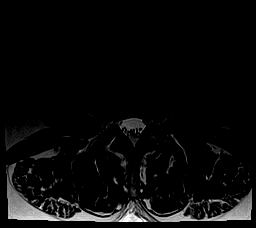
[im 27/39]
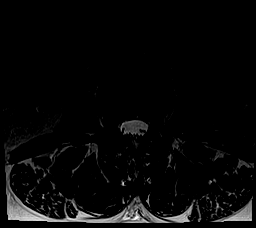
[im 31/39]
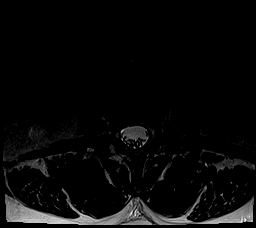
[im 35/39]
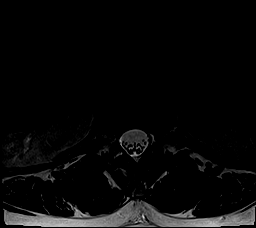
[im 39/39]
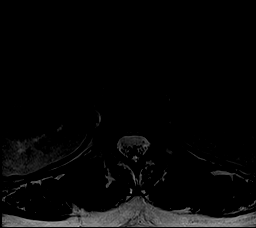

[27 of 48 positions shown; findings below may reference images not displayed]

FINDINGS: Segmentation: The lowest lumbar type non-rib-bearing vertebra is
labeled as L5.

Alignment:  No vertebral subluxation is observed.

Vertebrae: Type 2 degenerative endplate findings at L4-5 and L5-S1
noted with associated disc desiccation and loss of disc height.
Lesser disc desiccation throughout the rest of the lumbar spine.
cm hemangioma in the L2 vertebral body

Conus medullaris and cauda equina: Conus extends to the L1 level.
Conus and cauda equina appear normal.

Paraspinal and other soft tissues: Peripelvic cysts there is likely
a right kidney lower pole cyst which is only partially included on
today's exam.

Disc levels:

L1-2: No impingement. The disc bulge at this level abuts the right
L1 nerve in the lateral extraforaminal space.

L2-3: Borderline left subarticular lateral recess stenosis due to
disc bulge, this has worsened compared to the prior MRI.

L3-4: Mild left subarticular lateral recess stenosis and borderline
central narrowing of the thecal sac due to disc bulge and mild facet
arthropathy, worsened from the prior MRI.

L4-5: Moderate right and mild left subarticular lateral recess
stenosis with mild bilateral foraminal stenosis and moderate central
narrowing of the thecal sac due to disc bulge, left greater than
right facet arthropathy, and intervertebral spurring. The right
foraminal impingement is mildly worsened from prior.

L5-S1: Mild to moderate right foraminal stenosis due to
intervertebral spurring. There is enhancing tissue surrounding the
right S1 nerve roots in the lateral recess favoring epidural
fibrosis. Prior right laminectomy. The disc material in the right
lateral recess shown on the 5918 exam is no longer appreciable.
IMPRESSION: 1. The prior extensive right lateral recess stenosis at L5-S1 due to
disc extrusion has resolved. However, there has otherwise been some
mildly progressive spondylosis and degenerative disc disease,
currently causing moderate impingement at L4-5; mild to moderate
impingement at L5-S1; and mild impingement at L3-4, as detailed
above.
2. The right S1 nerve roots course through epidural fibrosis in the
right lateral recess.
